# Patient Record
Sex: Male | Born: 1995 | Race: White | Hispanic: No | Marital: Single
Health system: Southern US, Community
[De-identification: ages and names within clinical notes are randomized; demographics above are authoritative.]

## PROBLEM LIST (undated history)

## (undated) DIAGNOSIS — R12 Heartburn: Secondary | ICD-10-CM

## (undated) DIAGNOSIS — G43909 Migraine, unspecified, not intractable, without status migrainosus: Secondary | ICD-10-CM

## (undated) DIAGNOSIS — F909 Attention-deficit hyperactivity disorder, unspecified type: Secondary | ICD-10-CM

## (undated) DIAGNOSIS — T8484XA Pain due to internal orthopedic prosthetic devices, implants and grafts, initial encounter: Secondary | ICD-10-CM

## (undated) DIAGNOSIS — Z969 Presence of functional implant, unspecified: Secondary | ICD-10-CM

## (undated) HISTORY — PX: TONSILLECTOMY: SUR1361

---
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
## 2016-06-29 IMAGING — CR DG ANKLE COMPLETE 3+V*R*
3 series · 3 of 3 positions shown · non-contrast
Comparison: None.

CLINICAL DATA: Right lateral ankle pain for 5 days.

EXAM:
RIGHT ANKLE - COMPLETE 3+ VIEW

[view not recorded (1 of 3)]
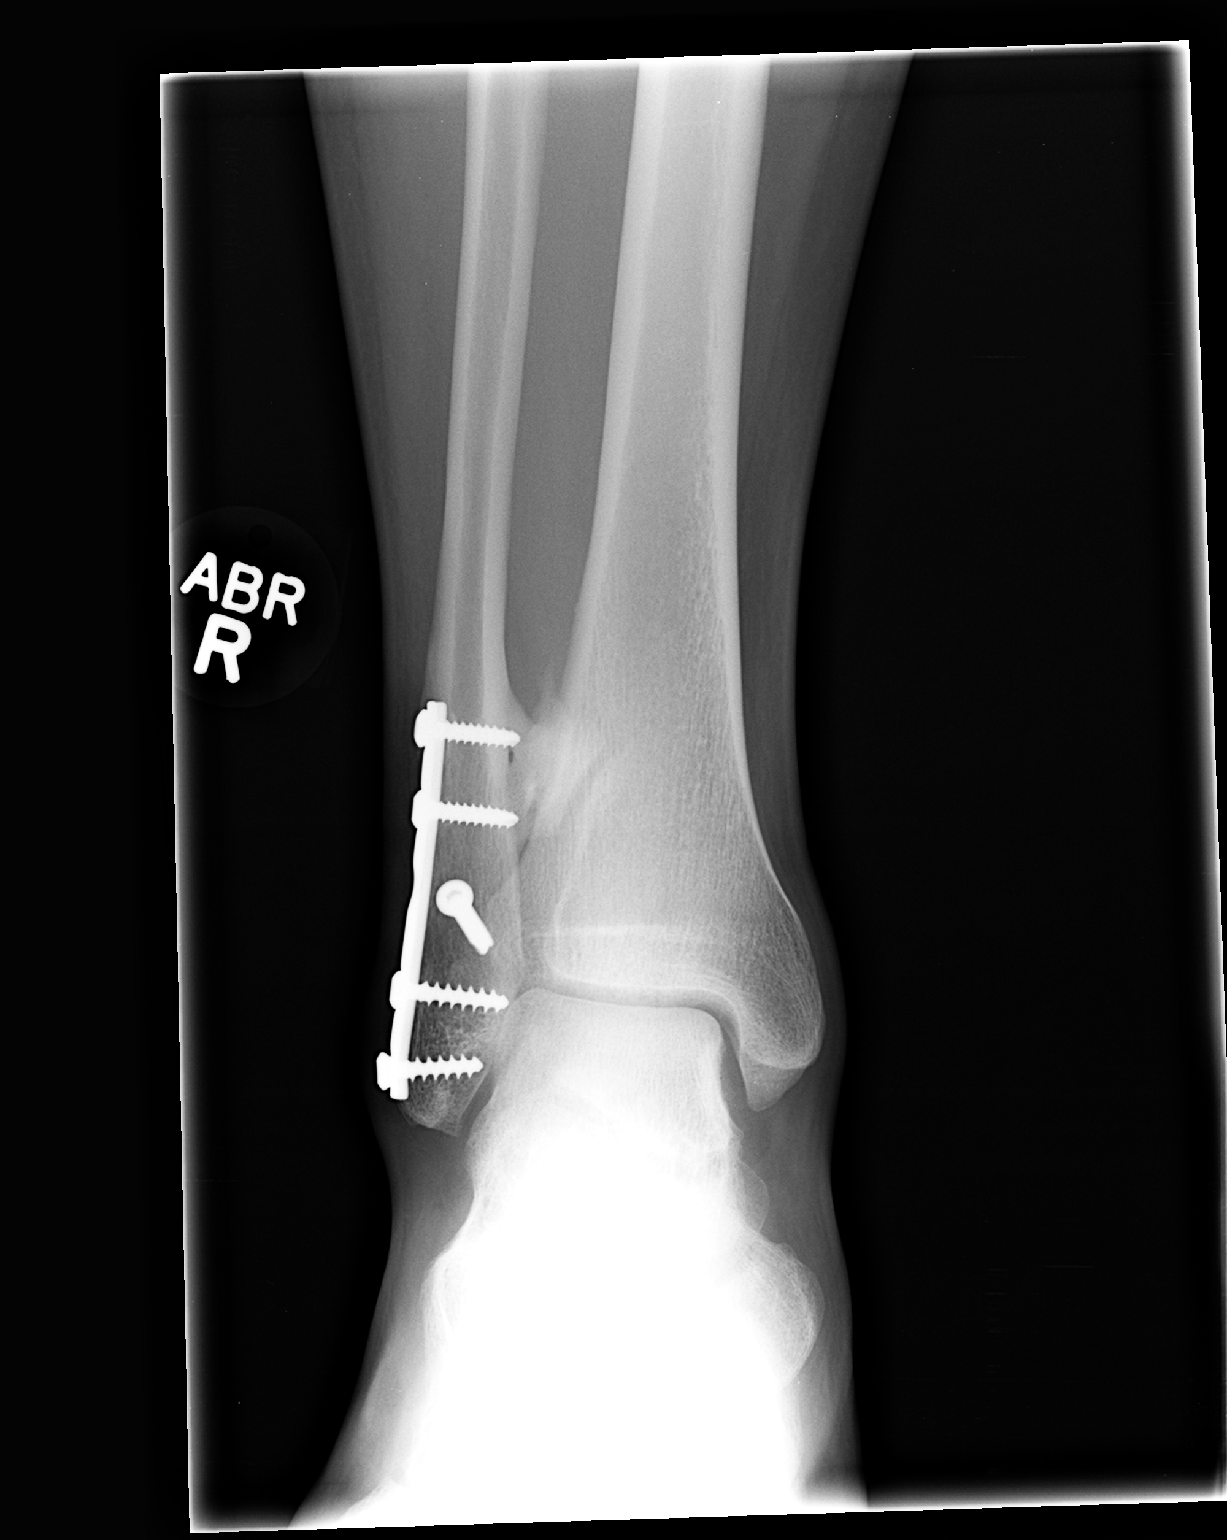

[view not recorded (2 of 3)]
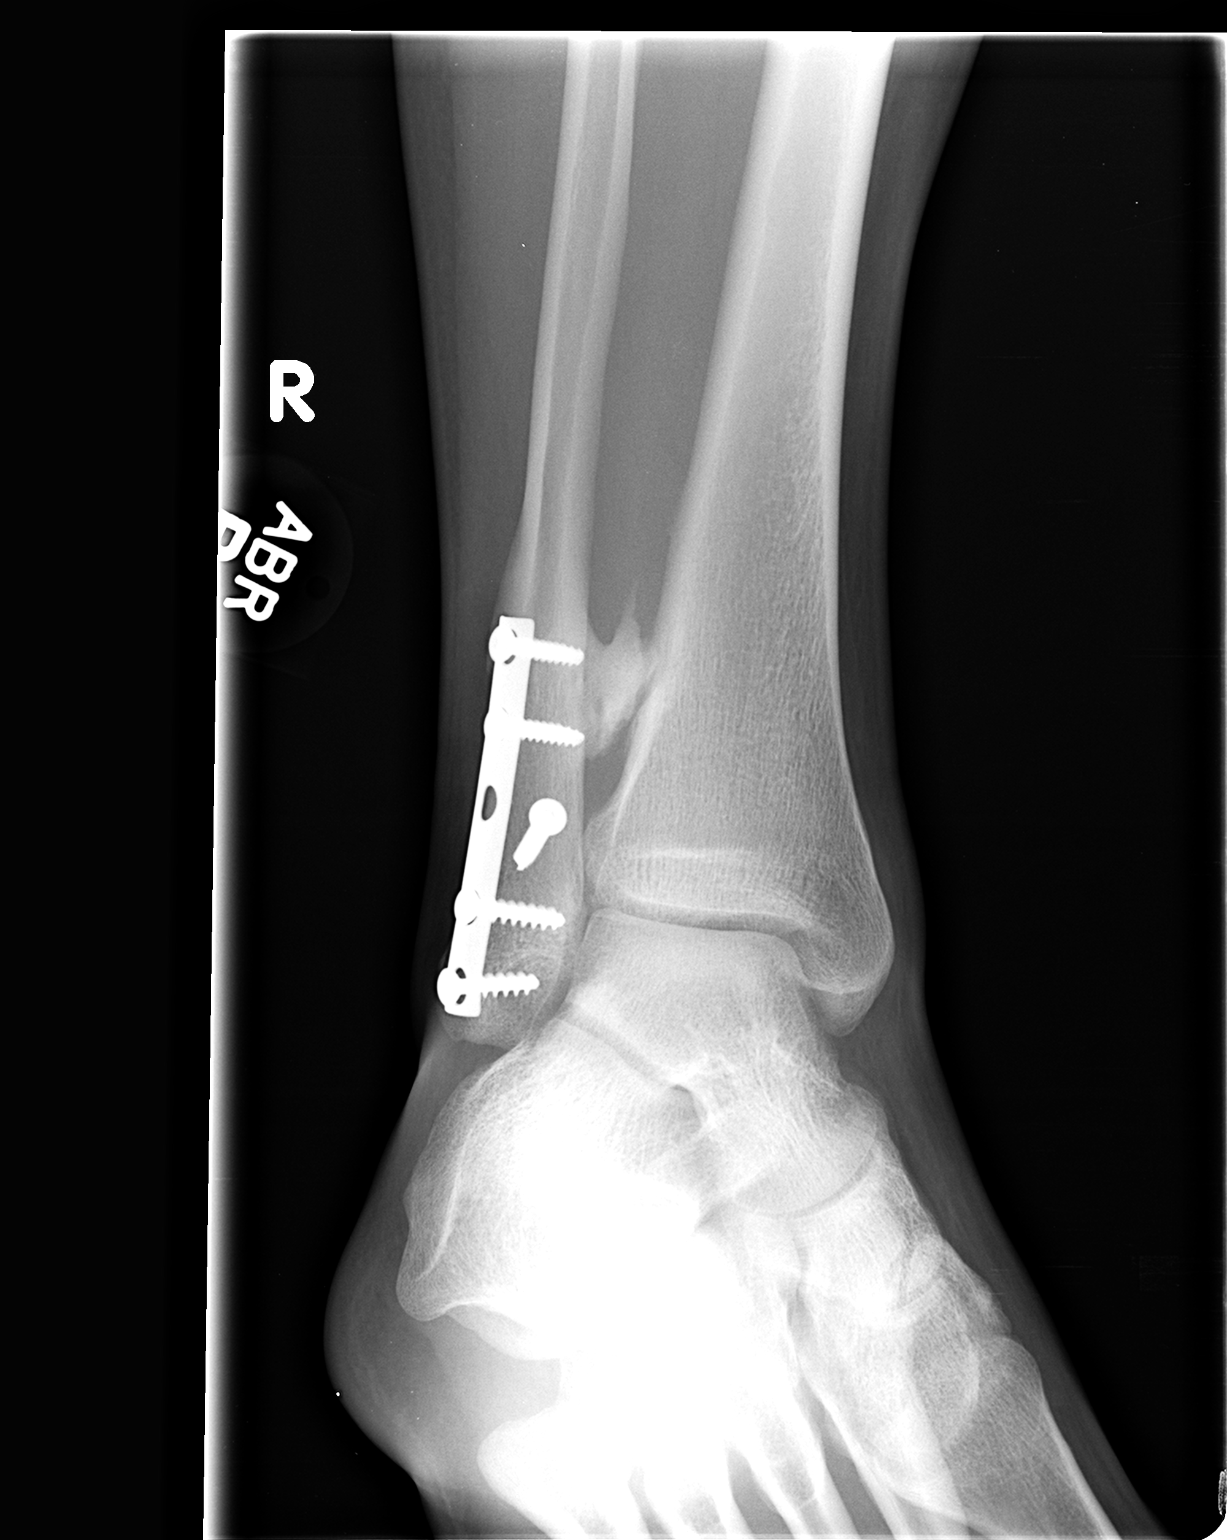

[view not recorded (3 of 3)]
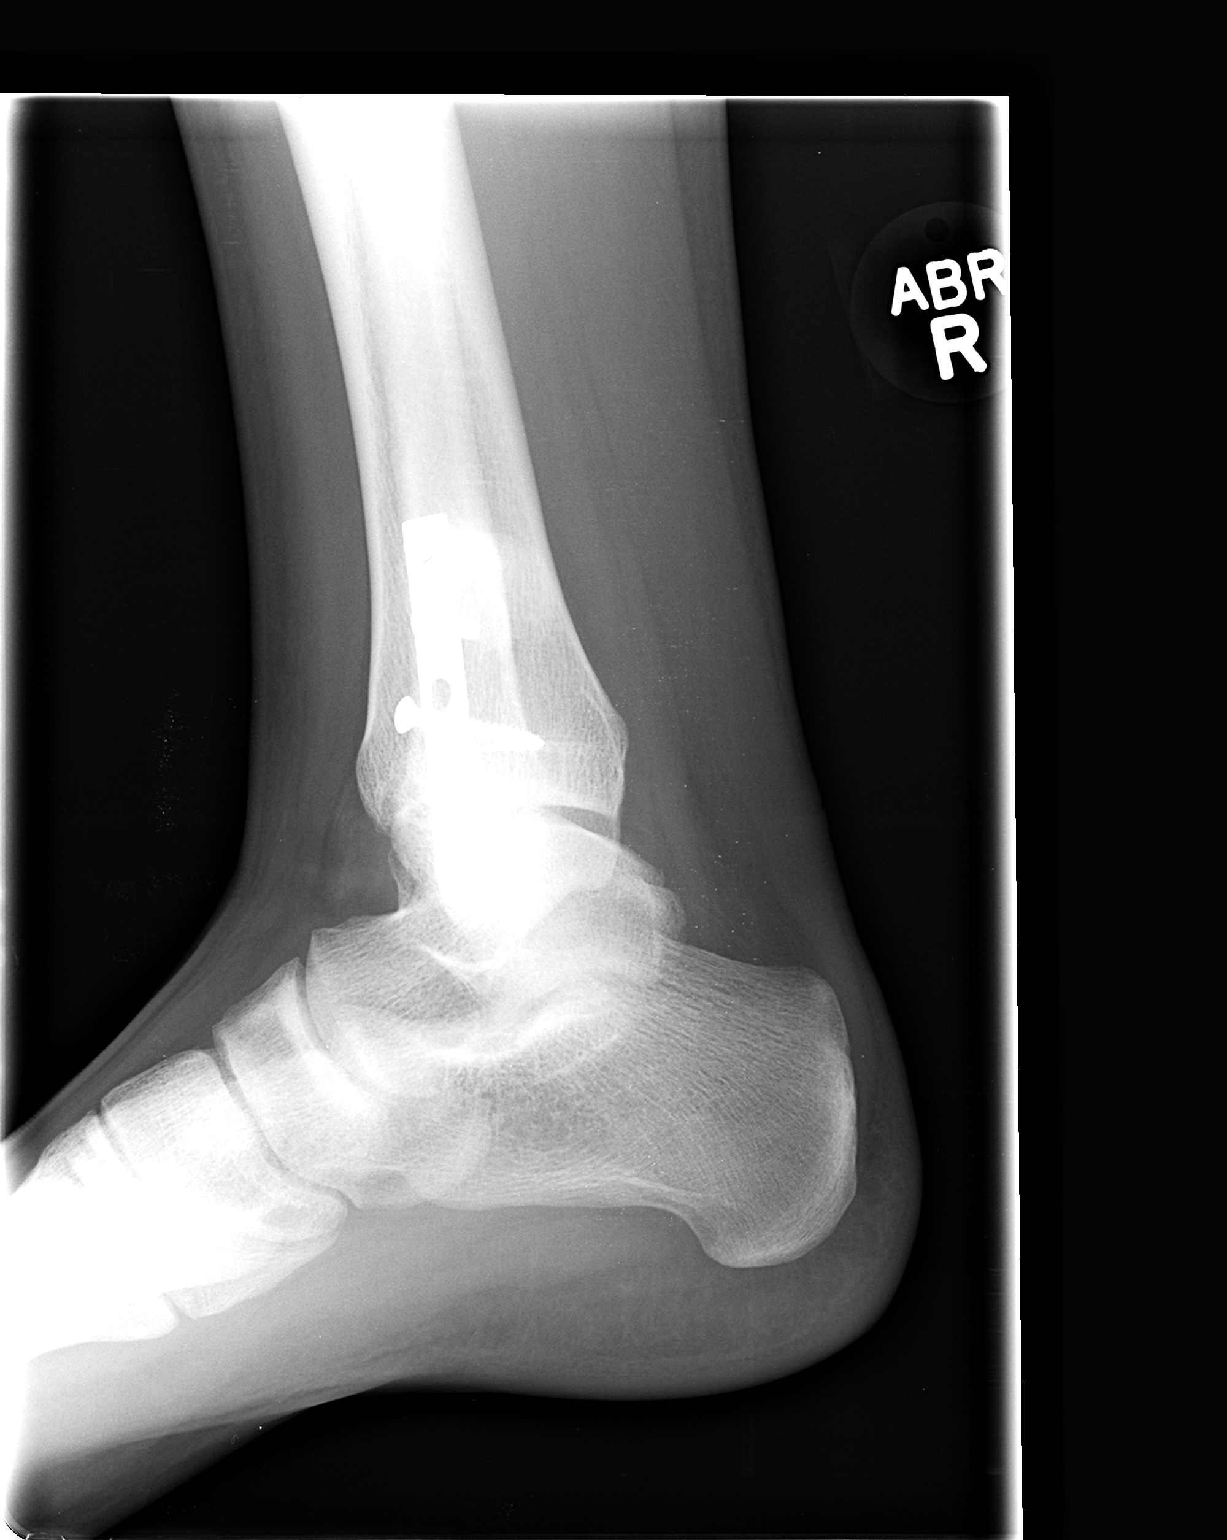

[3 of 3 positions shown; findings below may reference images not displayed]

FINDINGS: There is no evidence of fracture or dislocation. The patient's
fibular hardware appears grossly intact, without evidence of
loosening. Heterotopic bone formation is noted along the
interosseous space. The ankle mortise is intact; the interosseous
space is otherwise grossly unremarkable. No talar tilt or
subluxation is seen.

The joint spaces are preserved. No significant soft tissue
abnormalities are seen.
IMPRESSION: No evidence of fracture or dislocation. Fibular hardware appears
grossly intact.

## 2024-01-26 ENCOUNTER — Ambulatory Visit: Payer: Self-pay | Admitting: Urology
